# Patient Record
Sex: Male | Born: 1985 | Race: Black or African American | Hispanic: No | Smoking: Never smoker
Health system: Southern US, Community
[De-identification: ages and names within clinical notes are randomized; demographics above are authoritative.]

## PROBLEM LIST (undated history)

## (undated) DIAGNOSIS — I10 Essential (primary) hypertension: Secondary | ICD-10-CM

---

## 2017-12-12 ENCOUNTER — Encounter (HOSPITAL_COMMUNITY): Payer: Self-pay | Admitting: Emergency Medicine

## 2017-12-12 ENCOUNTER — Ambulatory Visit (HOSPITAL_COMMUNITY)
Admission: EM | Admit: 2017-12-12 | Discharge: 2017-12-12 | Disposition: A | Discharge status: Home / Self Care | Payer: Self-pay | Attending: Family Medicine | Admitting: Family Medicine

## 2017-12-12 ENCOUNTER — Emergency Department (HOSPITAL_COMMUNITY)
Admission: EM | Admit: 2017-12-12 | Discharge: 2017-12-12 | Disposition: A | Discharge status: Home / Self Care | Payer: Self-pay | Attending: Emergency Medicine | Admitting: Emergency Medicine

## 2017-12-12 ENCOUNTER — Other Ambulatory Visit: Payer: Self-pay

## 2017-12-12 DIAGNOSIS — J111 Influenza due to unidentified influenza virus with other respiratory manifestations: Secondary | ICD-10-CM

## 2017-12-12 DIAGNOSIS — R69 Illness, unspecified: Secondary | ICD-10-CM

## 2017-12-12 DIAGNOSIS — R6889 Other general symptoms and signs: Secondary | ICD-10-CM

## 2017-12-12 LAB — RAPID STREP SCREEN (MED CTR MEBANE ONLY): Streptococcus, Group A Screen (Direct): NEGATIVE

## 2017-12-12 MED ORDER — HYDROCODONE-HOMATROPINE 5-1.5 MG/5ML PO SYRP: 5.0000 mL | ORAL_SOLUTION | Freq: Four times a day (QID) | ORAL | 0 refills | Status: AC | PRN

## 2017-12-12 MED ORDER — ACETAMINOPHEN 325 MG PO TABS: 650.0000 mg | ORAL_TABLET | Freq: Once | ORAL | Status: DC

## 2017-12-12 MED ORDER — IBUPROFEN 400 MG PO TABS
600.0000 mg | ORAL_TABLET | Freq: Once | ORAL | Status: AC
  Administered 2017-12-12: 600 mg via ORAL
  Filled 2017-12-12: qty 1

## 2017-12-12 MED ORDER — ACETAMINOPHEN 325 MG PO TABS
ORAL_TABLET | ORAL | Status: AC
  Filled 2017-12-12: qty 2

## 2017-12-12 MED ORDER — OSELTAMIVIR PHOSPHATE 75 MG PO CAPS: 75.0000 mg | ORAL_CAPSULE | Freq: Two times a day (BID) | ORAL | 0 refills | Status: AC

## 2017-12-12 NOTE — ED Triage Notes (Signed)
[facility] and they gave him cough medicine and sent him home.  He took the medicine, went to sleep,  But didn't feel any better, so he has come here.  C/o headache, upper respiratory congestion and body aches/fevers.

## 2017-12-12 NOTE — Discharge Instructions (Signed)
Take Tylenol and Ibuprofen for pain and fever Rest and drink plenty of fluids Return if worsening or you are not getting better in one week

## 2017-12-12 NOTE — ED Provider Notes (Signed)
[facility] Provider Note   CSN: [id number] Arrival date & time: 12/12/17  1644     History   Chief Complaint Chief Complaint  Patient presents with  . Influenza    symptoms    HPI Gregory Fernandez is a 32 y.o. male who presents with a fever.  No significant past medical history.  Patient states that his symptoms started 3 days ago.  He reports fever, chills, body aches, nasal congestion, sore throat, headache, dry cough.  He also has some chest pain with coughing.  He went to urgent care today who prescribed him Tamiflu and cough medicine.  He became worried because his fever would not go down.  He has not taken any Tylenol or ibuprofen.  He denies shortness of breath, wheezing, abdominal pain, nausea, vomiting, diarrhea.  He reports sick contacts who have had the flu.  He has not had a flu shot.  HPI  History reviewed. No pertinent past medical history.  There are no active problems to display for this patient.   History reviewed. No pertinent surgical history.     Home Medications    Prior to Admission medications   Medication Sig Start Date End Date Taking? Authorizing Provider  HYDROcodone-homatropine (HYCODAN) 5-1.5 MG/5ML syrup Take 5 mLs by mouth every 6 (six) hours as needed for cough. 12/12/17   Mardella LaymanHagler, Brian, MD  oseltamivir (TAMIFLU) 75 MG capsule Take 1 capsule (75 mg total) by mouth 2 (two) times daily for 5 days. 12/12/17 12/17/17  Mardella LaymanHagler, Brian, MD    Family History No family history on file.  Social History Social History   Tobacco Use  . Smoking status: Never Smoker  . Smokeless tobacco: Never Used  Substance Use Topics  . Alcohol use: Yes    Comment: occ  . Drug use: Not on file     Allergies   Patient has no known allergies.   Review of Systems Review of Systems  Constitutional: Positive for chills and fever.  HENT: Positive for congestion and sore throat. Negative for ear pain and rhinorrhea.     Respiratory: Positive for cough. Negative for shortness of breath and wheezing.   Cardiovascular: Positive for chest pain.  Gastrointestinal: Negative for abdominal pain, diarrhea, nausea and vomiting.  Musculoskeletal: Positive for myalgias.  All other systems reviewed and are negative.    Physical Exam Updated Vital Signs BP (!) 157/98 (BP Location: Right Arm)   Pulse (!) 111   Temp (!) 103.1 F (39.5 C) (Oral)   Resp 18   Ht 6\' 2"  (1.88 m)   Wt 99.3 kg (219 lb)   SpO2 98%   BMI 28.12 kg/m   Physical Exam  Constitutional: He is oriented to person, place, and time. He appears well-developed and well-nourished. No distress.  HENT:  Head: Normocephalic and atraumatic.  Right Ear: Hearing, tympanic membrane, external ear and ear canal normal.  Left Ear: Hearing, tympanic membrane, external ear and ear canal normal.  Nose: Mucosal edema present.  Mouth/Throat: Uvula is midline, oropharynx is clear and moist and mucous membranes are normal.  Eyes: Conjunctivae are normal. Pupils are equal, round, and reactive to light. Right eye exhibits no discharge. Left eye exhibits no discharge. No scleral icterus.  Neck: Normal range of motion.  Cardiovascular: Normal rate and regular rhythm.  Pulmonary/Chest: Effort normal and breath sounds normal. No respiratory distress.  Abdominal: He exhibits no distension.  Neurological: He is alert and oriented to person, place, and time.  Skin: Skin is warm and dry.  Psychiatric: He has a normal mood and affect. His behavior is normal.  Nursing note and vitals reviewed.    ED Treatments / Results  Labs (all labs ordered are listed, but only abnormal results are displayed) Labs Reviewed  RAPID STREP SCREEN (NOT AT [facility])  CULTURE, GROUP A STREP [facility])    EKG  EKG Interpretation None       Radiology No results found.  Procedures Procedures (including critical care time)  Medications Ordered in ED Medications  ibuprofen  (ADVIL,MOTRIN) tablet 600 mg (600 mg Oral Given 12/12/17 1835)     Initial Impression / Assessment and Plan / ED Course  I have reviewed the triage vital signs and the nursing notes.  Pertinent labs & imaging results that were available during my care of the patient were reviewed by me and considered in my medical decision making (see chart for details).  32 year old male presents with flulike symptoms.  He is febrile to 103.1 tachycardic and hypertensive.  Otherwise vital signs are normal.  He was seen in quick look and given ibuprofen.  On my examination his temperature has gone down to 99.2.  He is not tachycardic.  He is mildly ill-appearing.  He was already prescribed Tamiflu by urgent care and given a work note.  He was encouraged to rest, hydrate, and to take ibuprofen and Tylenol for his fever.  He is advised to return for worsening or persistent symptoms.  Final Clinical Impressions(s) / ED Diagnoses   Final diagnoses:  Flu-like symptoms    ED Discharge Orders    None       Bethel Born, PA-C 12/12/17 2135    Mancel Bale, MD 12/13/17 [phone]

## 2017-12-12 NOTE — ED Provider Notes (Signed)
Patient placed in Quick Look pathway, seen and evaluated   Chief Complaint: Flulike symptoms  HPI: Patient presents with acute onset of nasal congestion, sore throat, and dry cough which began on Monday 3 days ago.  Endorses myalgias.  No shortness of breath.  Mild aching anterior chest wall pain with cough.  Cough is nonproductive.  Has been taking DayQuil and ibuprofen last dose last night without significant relief of his symptoms.  Was seen in urgent care earlier today and discharged with cough medication but returns to the ED today with stating that his symptoms worsened.  He has not had any antipyretic medications today.  ROS: Positive for nasal congestion, sore throat, fever, cough, myalgias  negative for shortness of breath, productive cough  Physical Exam:   Gen: No distress  Neuro: Awake and Alert  Skin: Warm    Focused Exam: Posterior oropharynx with tonsillar hypertrophy and erythema, no uvular deviation.  No trismus or sublingual abnormalities.  Nasal septum midline with mucosal edema bilaterally.  No frontal or maxillary sinus tenderness.  Lungs are clear to auscultation bilaterally.  He is tachycardic, however febrile.  No chest wall tenderness.   Initiation of care has begun. The patient has been counseled on the process, plan, and necessity for staying for the completion/evaluation, and the remainder of the medical screening examination    Jeanie SewerFawze, Janith Nielson A, PA-C 12/12/17 1821    Jacalyn LefevreHaviland, Julie, MD 12/19/17 [phone]

## 2017-12-12 NOTE — ED Triage Notes (Signed)
Pt states his daughter had the flu over the weekend, pt c/o body aches, fatigue, weakness, fever x2 days.

## 2017-12-12 NOTE — ED Provider Notes (Signed)
  [facility]   [id number] 12/12/17 Arrival Time: 1007  ASSESSMENT & PLAN:  1. Influenza-like illness     Meds ordered this encounter  Medications  . acetaminophen (TYLENOL) tablet 650 mg  . HYDROcodone-homatropine (HYCODAN) 5-1.5 MG/5ML syrup    Sig: Take 5 mLs by mouth every 6 (six) hours as needed for cough.    Dispense:  90 mL    Refill:  0  . oseltamivir (TAMIFLU) 75 MG capsule    Sig: Take 1 capsule (75 mg total) by mouth 2 (two) times daily for 5 days.    Dispense:  10 capsule    Refill:  0   Cough medication sedation precautions. Discussed typical duration of symptoms. OTC symptom care as needed. Ensure adequate fluid intake and rest. May f/u with PCP or here as needed.  Reviewed expectations re: course of current medical issues. Questions answered. Outlined signs and symptoms indicating need for more acute intervention. Patient verbalized understanding. After Visit Summary given.   SUBJECTIVE: History from: patient.  Gregory Fernandez is a 32 y.o. male who presents with complaint of nasal congestion, post-nasal drainage, and a persistent dry cough. Onset abrupt, approximately 1 day ago. Overall fatigued with body aches. SOB: none. Wheezing: none. Fever: yes. Overall normal PO intake without n/v. Sick contacts: yes, daughter with the flu. OTC treatment: Tylenol with mild help.  Received flu shot this year: no.  Social History   Tobacco Use  Smoking Status Never Smoker    ROS: As per HPI.   OBJECTIVE:  Vitals:   12/12/17 1052  BP: (!) 154/93  Pulse: (!) 135  Resp: 18  Temp: (!) 102.7 F (39.3 C)  SpO2: 99%     General appearance: alert; appears fatigued HEENT: nasal congestion; clear runny nose; throat irritation secondary to post-nasal drainage Neck: supple without LAD Lungs: unlabored respirations, symmetrical air entry; cough: moderate; no respiratory distress Skin: warm and dry Psychological: alert and cooperative; normal mood and  affect  Imaging: No results found.  No Known Allergies   Social History   Socioeconomic History  . Marital status: Single    Spouse name: Not on file  . Number of children: Not on file  . Years of education: Not on file  . Highest education level: Not on file  Social Needs  . Financial resource strain: Not on file  . Food insecurity - worry: Not on file  . Food insecurity - inability: Not on file  . Transportation needs - medical: Not on file  . Transportation needs - non-medical: Not on file  Occupational History  . Not on file  Tobacco Use  . Smoking status: Never Smoker  Substance and Sexual Activity  . Alcohol use: Yes  . Drug use: Not on file  . Sexual activity: Not on file  Other Topics Concern  . Not on file  Social History Narrative  . Not on file           Mardella LaymanHagler, Yoan Sallade, MD 12/12/17 1110

## 2017-12-12 NOTE — Discharge Instructions (Signed)

## 2017-12-15 LAB — CULTURE, GROUP A STREP (THRC)

## 2018-07-28 ENCOUNTER — Encounter (HOSPITAL_COMMUNITY): Payer: Self-pay | Admitting: Emergency Medicine

## 2018-07-28 ENCOUNTER — Other Ambulatory Visit: Payer: Self-pay

## 2018-07-28 ENCOUNTER — Emergency Department (HOSPITAL_COMMUNITY)
Admission: EM | Admit: 2018-07-28 | Discharge: 2018-07-28 | Disposition: A | Discharge status: Home / Self Care | Payer: Self-pay | Attending: Emergency Medicine | Admitting: Emergency Medicine

## 2018-07-28 ENCOUNTER — Emergency Department (HOSPITAL_COMMUNITY)
Admission: EM | Admit: 2018-07-28 | Discharge: 2018-07-29 | Disposition: A | Discharge status: Home / Self Care | Payer: Self-pay | Attending: Emergency Medicine | Admitting: Emergency Medicine

## 2018-07-28 DIAGNOSIS — Y939 Activity, unspecified: Secondary | ICD-10-CM | POA: Insufficient documentation

## 2018-07-28 DIAGNOSIS — Y92410 Unspecified street and highway as the place of occurrence of the external cause: Secondary | ICD-10-CM | POA: Insufficient documentation

## 2018-07-28 DIAGNOSIS — Y999 Unspecified external cause status: Secondary | ICD-10-CM | POA: Insufficient documentation

## 2018-07-28 DIAGNOSIS — S0990XA Unspecified injury of head, initial encounter: Secondary | ICD-10-CM | POA: Insufficient documentation

## 2018-07-28 DIAGNOSIS — Y998 Other external cause status: Secondary | ICD-10-CM | POA: Insufficient documentation

## 2018-07-28 DIAGNOSIS — Z5321 Procedure and treatment not carried out due to patient leaving prior to being seen by health care provider: Secondary | ICD-10-CM | POA: Insufficient documentation

## 2018-07-28 DIAGNOSIS — Y9389 Activity, other specified: Secondary | ICD-10-CM | POA: Insufficient documentation

## 2018-07-28 DIAGNOSIS — R51 Headache: Secondary | ICD-10-CM | POA: Insufficient documentation

## 2018-07-28 DIAGNOSIS — I10 Essential (primary) hypertension: Secondary | ICD-10-CM | POA: Insufficient documentation

## 2018-07-28 HISTORY — DX: Essential (primary) hypertension: I10

## 2018-07-28 NOTE — ED Notes (Signed)
Called for patient 2nd time. No response.
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
## 2018-07-28 NOTE — ED Notes (Signed)
Called for patient in lobby. No response

## 2018-07-29 MED ORDER — IBUPROFEN 400 MG PO TABS
600.0000 mg | ORAL_TABLET | Freq: Once | ORAL | Status: AC
  Administered 2018-07-29: 600 mg via ORAL
  Filled 2018-07-29: qty 1

## 2018-07-29 NOTE — Discharge Instructions (Signed)
°  Head Injury °You have been seen today for a head injury. It does not appear to be serious at this time.  °Close observation: The close observation period is usually 6 hours from the injury. This includes staying awake and having a trustworthy adult monitor you to assure your condition does not worsen. You should be in regular contact with this person and ideally, they should be able to monitor you in person.  °Secondary observation: The secondary observation period is usually 24 hours from the injury. You are allowed to sleep during this time. A trustworthy adult should intermittently monitor you to assure your condition does not worsen.  ° °Overall head injury/concussion care: °Rest: Be sure to get plenty of rest. You will need more rest and sleep while you recover. °Hydration: Be sure to stay well hydrated by having a goal of drinking about 0.5 liters of water an hour. °Pain:  °Antiinflammatory medications: Take 600 mg of ibuprofen every 6 hours or 440 mg (over the counter dose) to 500 mg (prescription dose) of naproxen every 12 hours or for the next 3 days. After this time, these medications may be used as needed for pain. Take these medications with food to avoid upset stomach. Choose only one of these medications, do not take them together. °Tylenol: Should you continue to have additional pain while taking the ibuprofen or naproxen, you may add in tylenol as needed. Your daily total maximum amount of tylenol from all sources should be limited to 4000mg/day for persons without liver problems, or 2000mg/day for those with liver problems. °Return to sports and activities: In general, you may return to normal activities once symptoms have subsided, however, you would ideally be cleared by a primary care provider or other qualified medical professional prior to return to these activities. ° °Follow up: Follow up with the concussion clinic or your primary care provider for further management of this issue. °Return:  Return to the ED should you begin to have confusion, abnormal behavior, aggression, violence, or personality changes, repeated vomiting, vision loss, numbness or weakness on one side of the body, difficulty standing due to dizziness, significantly worsening pain, or any other major concerns. °
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
## 2021-09-28 ENCOUNTER — Ambulatory Visit (HOSPITAL_COMMUNITY)
Admission: EM | Admit: 2021-09-28 | Discharge: 2021-09-28 | Disposition: A | Discharge status: Home / Self Care | Payer: Self-pay | Attending: Urgent Care | Admitting: Urgent Care

## 2021-09-28 ENCOUNTER — Encounter (HOSPITAL_COMMUNITY): Payer: Self-pay

## 2021-09-28 ENCOUNTER — Ambulatory Visit (INDEPENDENT_AMBULATORY_CARE_PROVIDER_SITE_OTHER): Payer: Self-pay

## 2021-09-28 ENCOUNTER — Other Ambulatory Visit: Payer: Self-pay

## 2021-09-28 DIAGNOSIS — B349 Viral infection, unspecified: Secondary | ICD-10-CM | POA: Insufficient documentation

## 2021-09-28 DIAGNOSIS — R059 Cough, unspecified: Secondary | ICD-10-CM

## 2021-09-28 DIAGNOSIS — Z20822 Contact with and (suspected) exposure to covid-19: Secondary | ICD-10-CM | POA: Insufficient documentation

## 2021-09-28 LAB — POC INFLUENZA A AND B ANTIGEN (URGENT CARE ONLY)
INFLUENZA A ANTIGEN, POC: NEGATIVE
INFLUENZA B ANTIGEN, POC: NEGATIVE

## 2021-09-28 NOTE — ED Provider Notes (Signed)
[facility]    CSN: [id number] Arrival date & time: 09/28/21  1022      History   Chief Complaint Chief Complaint  Patient presents with   Nasal Congestion   Cough   Headache    HPI Gregory Fernandez is a 35 y.o. male.   Pleasant 35 year old male presents today with complaints of acute cough, headache, nasal congestion.  He states his symptoms started mildly last evening, but woke up this morning with severe myalgias and weakness as well.  He showed up to work, he works in [employer] outdoors, and states he felt so weak he almost fell over.  He denies any nuchal rigidity.  He feels feverish with cold chills, but has not taken his temperature at home.  He has not taken any over-the-counter medications for his symptoms.  He states he had his children home for Christmas, 40 years old and 34 years old, both of which now have viral infections.  He does not believe it to be flu or COVID but is uncertain.  Patient denies any chronic pulmonary issues, and does not smoke.  He states he used to be on blood pressure medication, but "got off of them".   Cough Associated symptoms: chills, fever, headaches and myalgias   Associated symptoms: no chest pain, no ear pain, no shortness of breath, no sore throat and no wheezing   Headache Associated symptoms: congestion, cough, fatigue, fever and myalgias   Associated symptoms: no abdominal pain, no drainage, no ear pain, no sinus pressure and no sore throat    Past Medical History:  Diagnosis Date   Hypertension     There are no problems to display for this patient.   History reviewed. No pertinent surgical history.     Home Medications    Prior to Admission medications   Medication Sig Start Date End Date Taking? Authorizing Provider  HYDROcodone-homatropine (HYCODAN) 5-1.5 MG/5ML syrup Take 5 mLs by mouth every 6 (six) hours as needed for cough. Patient not taking: Reported on 07/28/2018 12/12/17   Mardella Layman, MD     Family History History reviewed. No pertinent family history.  Social History Social History   Tobacco Use   Smoking status: Never   Smokeless tobacco: Never  Vaping Use   Vaping Use: Never used  Substance Use Topics   Alcohol use: Yes    Comment: occ     Allergies   Patient has no known allergies.   Review of Systems Review of Systems  Constitutional:  Positive for chills, fatigue and fever.  HENT:  Positive for congestion. Negative for ear discharge, ear pain, facial swelling, nosebleeds, postnasal drip, sinus pressure, sinus pain, sneezing and sore throat.   Respiratory:  Positive for cough. Negative for shortness of breath, wheezing and stridor.   Cardiovascular:  Negative for chest pain and palpitations.  Gastrointestinal:  Negative for abdominal pain.  Musculoskeletal:  Positive for myalgias.  Neurological:  Positive for headaches.    Physical Exam Triage Vital Signs ED Triage Vitals  Enc Vitals Group     BP 09/28/21 1239 (!) 172/80     Pulse Rate 09/28/21 1239 73     Resp 09/28/21 1239 18     Temp 09/28/21 1239 99.3 F (37.4 C)     Temp Source 09/28/21 1239 Oral     SpO2 09/28/21 1239 100 %     Weight --      Height --      Head Circumference --  Peak Flow --      Pain Score 09/28/21 1240 0     Pain Loc --      Pain Edu? --      Excl. in GC? --    No data found.  Updated Vital Signs BP (!) 172/80 (BP Location: Left Arm)    Pulse 73    Temp 99.3 F (37.4 C) (Oral)    Resp 18    SpO2 100%   Visual Acuity Right Eye Distance:   Left Eye Distance:   Bilateral Distance:    Right Eye Near:   Left Eye Near:    Bilateral Near:     Physical Exam Vitals and nursing note reviewed.  Constitutional:      General: He is not in acute distress.    Appearance: He is well-developed and normal weight. He is ill-appearing. He is not toxic-appearing or diaphoretic.  HENT:     Head: Normocephalic and atraumatic.     Mouth/Throat:     Mouth: Mucous  membranes are moist.     Pharynx: Oropharynx is clear.  Eyes:     General: No scleral icterus.    Extraocular Movements: Extraocular movements intact.     Right eye: Normal extraocular motion and no nystagmus.     Left eye: No nystagmus.     Pupils: Pupils are equal, round, and reactive to light. Pupils are equal.  Cardiovascular:     Rate and Rhythm: Normal rate and regular rhythm.     Heart sounds: Normal heart sounds. No murmur heard.   No friction rub. No gallop.  Pulmonary:     Effort: Pulmonary effort is normal. No respiratory distress.     Comments: Decreased breath sounds bilaterally Musculoskeletal:        General: No swelling or tenderness. Normal range of motion.     Cervical back: Normal range of motion and neck supple. No rigidity.  Lymphadenopathy:     Cervical: No cervical adenopathy.  Skin:    General: Skin is warm.     Coloration: Skin is not cyanotic.     Findings: No erythema.  Neurological:     Mental Status: He is alert.     UC Treatments / Results  Labs (all labs ordered are listed, but only abnormal results are displayed) Labs Reviewed  SARS CORONAVIRUS 2 (TAT 6-24 HRS)  POC INFLUENZA A AND B ANTIGEN (URGENT CARE ONLY)    EKG   Radiology DG Chest 2 View  Result Date: 09/28/2021 CLINICAL DATA:  cough, viral syndrome with O2 ranging between 85-92% EXAM: CHEST - 2 VIEW COMPARISON:  None. FINDINGS: Normal heart size. Normal mediastinal contour. No pneumothorax. No pleural effusion. Lungs appear clear, with no acute consolidative airspace disease and no pulmonary edema. IMPRESSION: No active cardiopulmonary disease. Electronically Signed   By: Delbert Phenix M.D.   On: 09/28/2021 14:22    Procedures Procedures (including critical care time)  Medications Ordered in UC Medications - No data to display  Initial Impression / Assessment and Plan / UC Course  I have reviewed the triage vital signs and the nursing notes.  Pertinent labs & imaging  results that were available during my care of the patient were reviewed by me and considered in my medical decision making (see chart for details).  Clinical Course as of 09/28/21 1429  Wed Sep 28, 2021  1421 O2 when checked by provider was between 88-92% on several fingers at different times [WC]    Clinical Course  User Index [WC] Homer, Ethyn Schetter L, PA    Viral syndrome - supportive care outlined below. Out of work x 3 days. Await covid results. Final Clinical Impressions(s) / UC Diagnoses   Final diagnoses:  Viral syndrome     Discharge Instructions      Your flu test is negative and your chest xray was normal. Your symptoms are likely related to a virus. We are awaiting the results of your covid test. Please do not return to work until test results obtained. Treat your aches or fever with ibuprofen alternating with tylenol. You may try OTC oscillococcinum.  Increase your water intake. REST!     ED Prescriptions   None    PDMP not reviewed this encounter.   Maretta Bees, [place] 09/28/21 1433

## 2021-09-28 NOTE — ED Triage Notes (Signed)
Pt presents to the office today for nasal congestion,coughing and headache x 2 days. Pt stated his children are sick with some type of virus.

## 2021-09-28 NOTE — Discharge Instructions (Signed)
Your flu test is negative and your chest xray was normal. Your symptoms are likely related to a virus. We are awaiting the results of your covid test. Please do not return to work until test results obtained. Treat your aches or fever with ibuprofen alternating with tylenol. You may try OTC oscillococcinum.  Increase your water intake. REST!

## 2021-09-29 LAB — SARS CORONAVIRUS 2 (TAT 6-24 HRS): SARS Coronavirus 2: NEGATIVE

## 2022-02-28 ENCOUNTER — Ambulatory Visit (HOSPITAL_COMMUNITY)
Admission: EM | Admit: 2022-02-28 | Discharge: 2022-02-28 | Disposition: A | Discharge status: Home / Self Care | Payer: Self-pay | Attending: Emergency Medicine | Admitting: Emergency Medicine

## 2022-02-28 ENCOUNTER — Ambulatory Visit (INDEPENDENT_AMBULATORY_CARE_PROVIDER_SITE_OTHER): Payer: Self-pay

## 2022-02-28 DIAGNOSIS — M542 Cervicalgia: Secondary | ICD-10-CM

## 2022-02-28 DIAGNOSIS — K219 Gastro-esophageal reflux disease without esophagitis: Secondary | ICD-10-CM

## 2022-02-28 MED ORDER — CYCLOBENZAPRINE HCL 10 MG PO TABS: 10.0000 mg | ORAL_TABLET | Freq: Two times a day (BID) | ORAL | 0 refills | Status: AC | PRN

## 2022-02-28 MED ORDER — FAMOTIDINE 20 MG PO TABS: 20.0000 mg | ORAL_TABLET | Freq: Two times a day (BID) | ORAL | 0 refills | Status: AC

## 2022-02-28 MED ORDER — PREDNISONE 20 MG PO TABS: 40.0000 mg | ORAL_TABLET | Freq: Every day | ORAL | 0 refills | Status: AC

## 2022-02-28 NOTE — Discharge Instructions (Addendum)
  Your neck x-ray was negative for any acute findings  Begin use of prednisone every morning with food for the next 5 days, this medication will reduce any inflammation or irritation to the muscles that may be exacerbating discomfort, while using this medication you may take Tylenol 500 to 1000 mg every 6 hours as needed, please avoid naproxen, ibuprofen, Motrin, Aleve, Advil until prednisone course is completed  You may take Flexeril twice a day as needed for additional comfort, be mindful this medication may make you drowsy, if this occurs you may take half dose or use at bedtime only   Use heat or ice over the affected area 10 to 15-minute intervals  You may place pillows behind your neck for support while sitting and lying  If your symptoms continue to persist she may follow-up with orthopedics for reevaluation but information is listed on for      Your symptoms are consistent with GERD  Begin use of famotidine twice daily for the next 14 days to help reduce stomach acid and minimize symptoms, you may take an additional over-the-counter medicine such as Tums, Pepto-Bismol and Maalox for additional comfort  While using this medication and attempt to eat a bland diet avoiding foods that are tomato-based, heavy and onions, spicy or greasy as they may cause further irritation  Wait at least 30 minutes to an hour before laying down after meals  If your symptoms have improved but continue to persist you may continue famotidine once daily for continued management  If your symptoms persist and/or worsen you may follow-up with urgent care as needed
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
